# Patient Record
Sex: Male | Born: 1955 | Race: Black or African American | Hispanic: No | Marital: Single | Smoking: Current every day smoker
Health system: Southern US, Community
[De-identification: ages and names within clinical notes are randomized; demographics above are authoritative.]

## PROBLEM LIST (undated history)

## (undated) DIAGNOSIS — I10 Essential (primary) hypertension: Secondary | ICD-10-CM

## (undated) DIAGNOSIS — E7800 Pure hypercholesterolemia, unspecified: Secondary | ICD-10-CM

## (undated) HISTORY — PX: SHOULDER SURGERY: SHX246

## (undated) HISTORY — PX: BACK SURGERY: SHX140

---

## 1998-11-12 ENCOUNTER — Emergency Department (HOSPITAL_COMMUNITY): Admission: EM | Admit: 1998-11-12 | Discharge: 1998-11-13 | Payer: Self-pay | Admitting: *Deleted

## 1999-09-28 ENCOUNTER — Emergency Department (HOSPITAL_COMMUNITY): Admission: EM | Admit: 1999-09-28 | Discharge: 1999-09-28 | Payer: Self-pay | Admitting: Emergency Medicine

## 1999-09-28 ENCOUNTER — Encounter: Payer: Self-pay | Admitting: Emergency Medicine

## 2000-05-31 ENCOUNTER — Emergency Department (HOSPITAL_COMMUNITY): Admission: EM | Admit: 2000-05-31 | Discharge: 2000-05-31 | Payer: Self-pay | Admitting: Emergency Medicine

## 2000-06-22 ENCOUNTER — Emergency Department (HOSPITAL_COMMUNITY): Admission: EM | Admit: 2000-06-22 | Discharge: 2000-06-22 | Payer: Self-pay | Admitting: Emergency Medicine

## 2000-06-24 ENCOUNTER — Emergency Department (HOSPITAL_COMMUNITY): Admission: EM | Admit: 2000-06-24 | Discharge: 2000-06-24 | Payer: Self-pay | Admitting: Emergency Medicine

## 2000-06-25 ENCOUNTER — Inpatient Hospital Stay (HOSPITAL_COMMUNITY): Admission: EM | Admit: 2000-06-25 | Discharge: 2000-06-27 | Payer: Self-pay

## 2000-06-29 ENCOUNTER — Encounter: Admission: RE | Admit: 2000-06-29 | Discharge: 2000-06-29 | Payer: Self-pay | Admitting: Hematology and Oncology

## 2000-08-02 ENCOUNTER — Encounter: Admission: RE | Admit: 2000-08-02 | Discharge: 2000-08-02 | Payer: Self-pay | Admitting: Hematology and Oncology

## 2001-01-21 ENCOUNTER — Emergency Department (HOSPITAL_COMMUNITY): Admission: EM | Admit: 2001-01-21 | Discharge: 2001-01-21 | Payer: Self-pay | Admitting: Emergency Medicine

## 2001-01-21 ENCOUNTER — Encounter: Payer: Self-pay | Admitting: Emergency Medicine

## 2001-02-07 ENCOUNTER — Encounter: Admission: RE | Admit: 2001-02-07 | Discharge: 2001-02-07 | Payer: Self-pay | Admitting: Orthopaedic Surgery

## 2001-02-07 ENCOUNTER — Encounter: Payer: Self-pay | Admitting: Orthopaedic Surgery

## 2001-02-16 ENCOUNTER — Ambulatory Visit: Admission: RE | Admit: 2001-02-16 | Discharge: 2001-02-16 | Payer: Self-pay | Admitting: Orthopaedic Surgery

## 2002-10-25 ENCOUNTER — Emergency Department (HOSPITAL_COMMUNITY): Admission: EM | Admit: 2002-10-25 | Discharge: 2002-10-25 | Payer: Self-pay | Admitting: Emergency Medicine

## 2003-05-17 ENCOUNTER — Emergency Department (HOSPITAL_COMMUNITY): Admission: EM | Admit: 2003-05-17 | Discharge: 2003-05-17 | Payer: Self-pay | Admitting: Emergency Medicine

## 2003-05-19 ENCOUNTER — Emergency Department (HOSPITAL_COMMUNITY): Admission: EM | Admit: 2003-05-19 | Discharge: 2003-05-19 | Payer: Self-pay | Admitting: Emergency Medicine

## 2003-06-06 ENCOUNTER — Emergency Department (HOSPITAL_COMMUNITY): Admission: EM | Admit: 2003-06-06 | Discharge: 2003-06-06 | Payer: Self-pay | Admitting: Emergency Medicine

## 2003-06-27 ENCOUNTER — Emergency Department (HOSPITAL_COMMUNITY): Admission: EM | Admit: 2003-06-27 | Discharge: 2003-06-27 | Payer: Self-pay | Admitting: Emergency Medicine

## 2003-12-20 ENCOUNTER — Emergency Department (HOSPITAL_COMMUNITY): Admission: EM | Admit: 2003-12-20 | Discharge: 2003-12-20 | Payer: Self-pay | Admitting: Emergency Medicine

## 2004-01-16 ENCOUNTER — Emergency Department (HOSPITAL_COMMUNITY): Admission: EM | Admit: 2004-01-16 | Discharge: 2004-01-16 | Payer: Self-pay | Admitting: Emergency Medicine

## 2005-02-12 ENCOUNTER — Emergency Department (HOSPITAL_COMMUNITY): Admission: EM | Admit: 2005-02-12 | Discharge: 2005-02-12 | Payer: Self-pay | Admitting: Emergency Medicine

## 2006-08-18 IMAGING — CT CT HEAD W/O CM
1 of 2 series · 13 of 30 positions shown, 17 images · IV contrast (agent unspecified)
Comparison: 01/16/04.

CLINICAL DATA: Headache. Sinus pressure. 
 HEAD CT WITHOUT CONTRAST:
TECHNIQUE: Contiguous axial images were obtained from the base of the skull through the vertex according to standard protocol without contrast.

[Series 2: brain · axial · 0.47mm/px · z∈[+149,+264]mm · 13 of 36 slices shown, 17 images]
[im 3/36  brain]
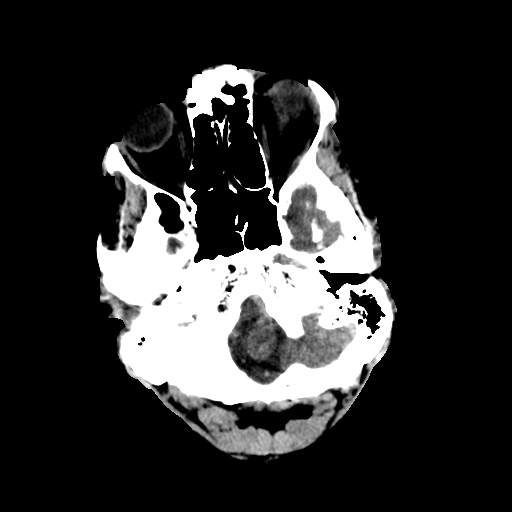
[im 3/36  bone]
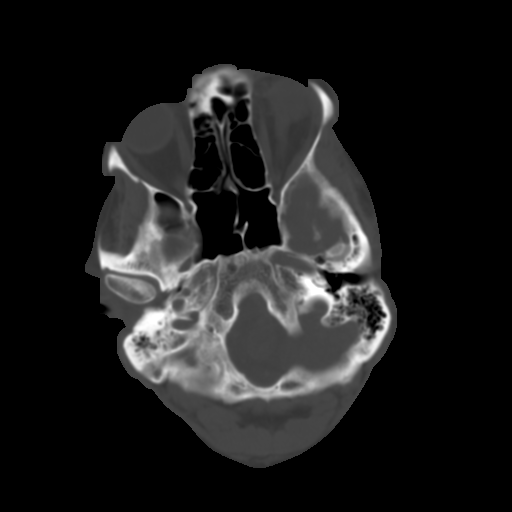
[im 6/36  brain]
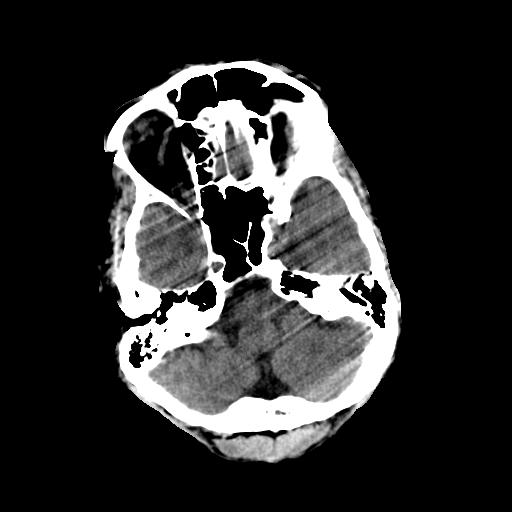
[im 8/36  brain]
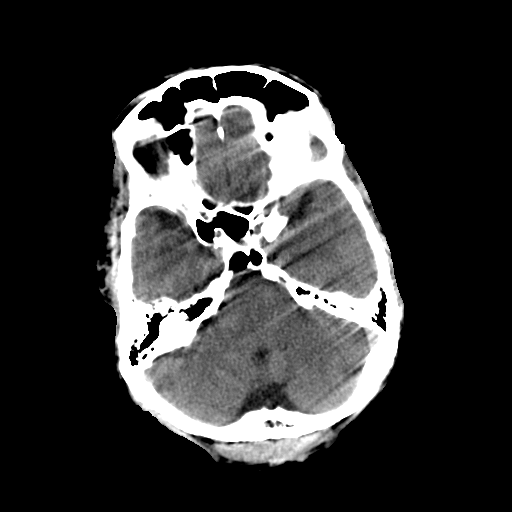
[im 11/36  brain]
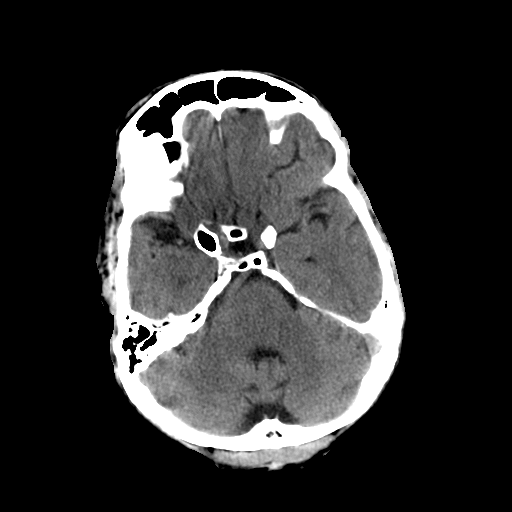
[im 13/36  brain]
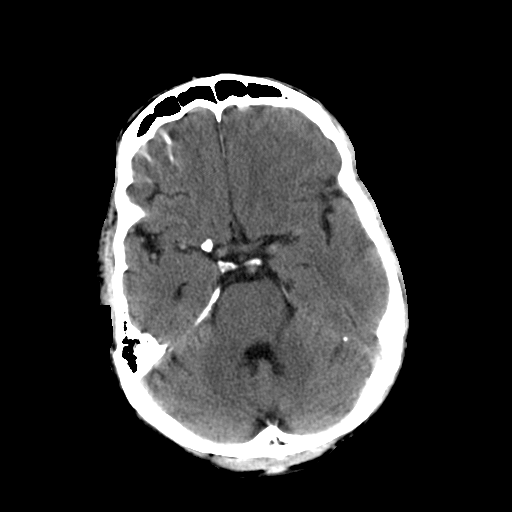
[im 13/36  bone]
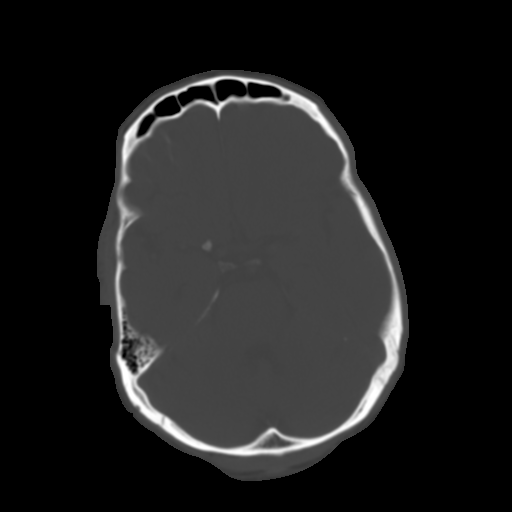
[im 16/36  brain]
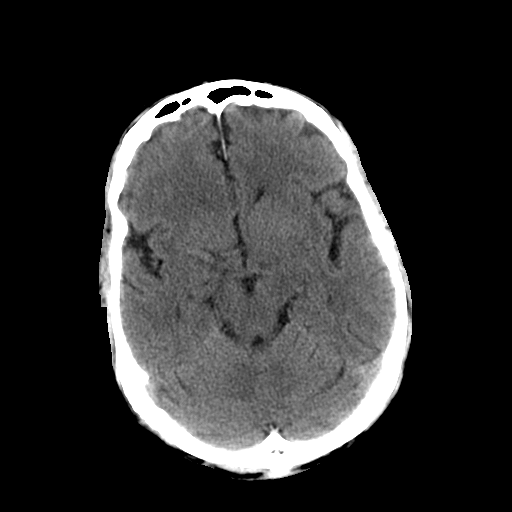
[im 18/36  brain]
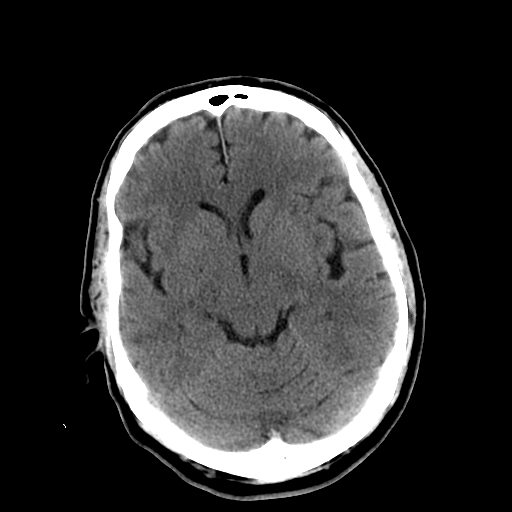
[im 21/36  brain]
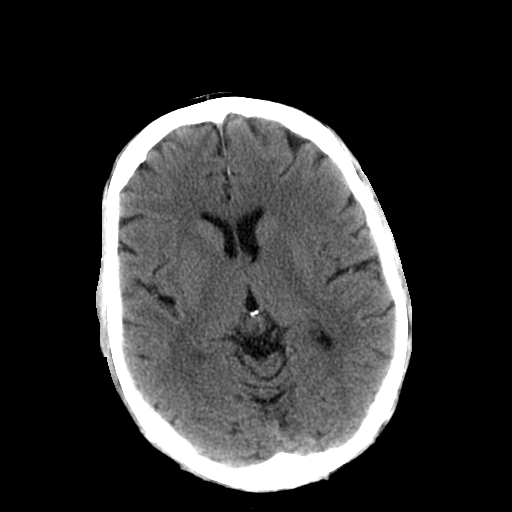
[im 23/36  brain]
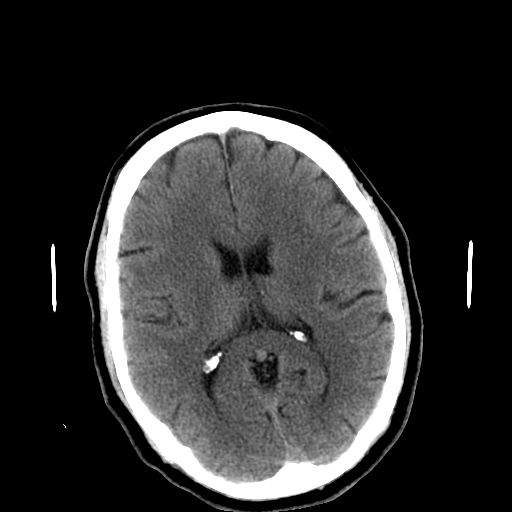
[im 23/36  bone]
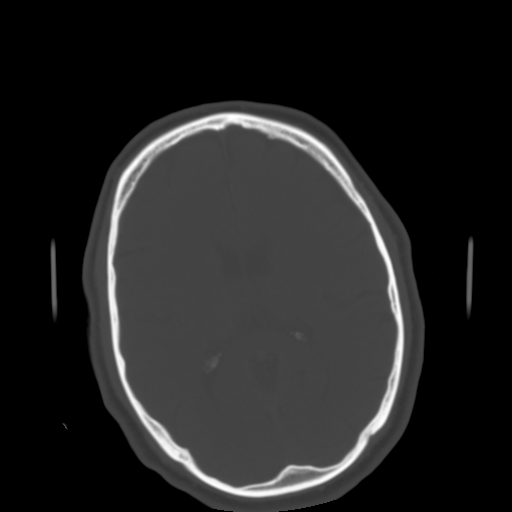
[im 26/36  brain]
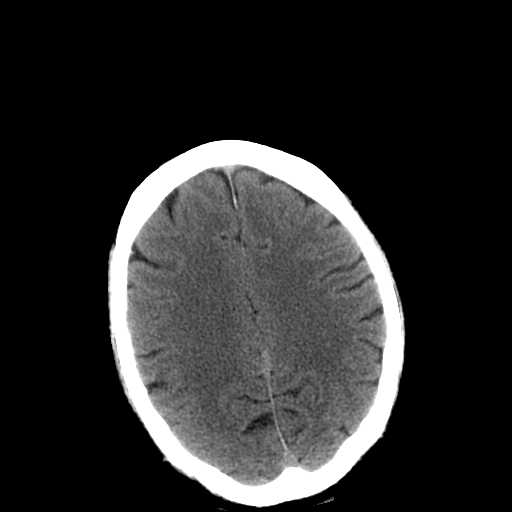
[im 28/36  brain]
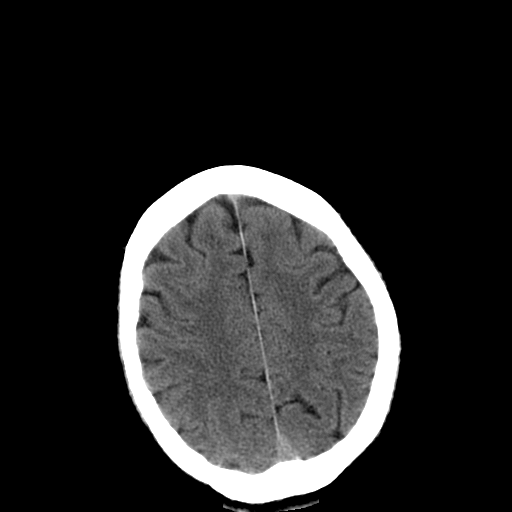
[im 31/36  brain]
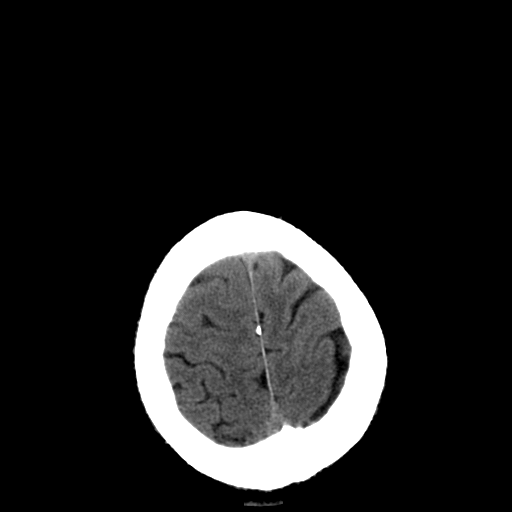
[im 33/36  brain]
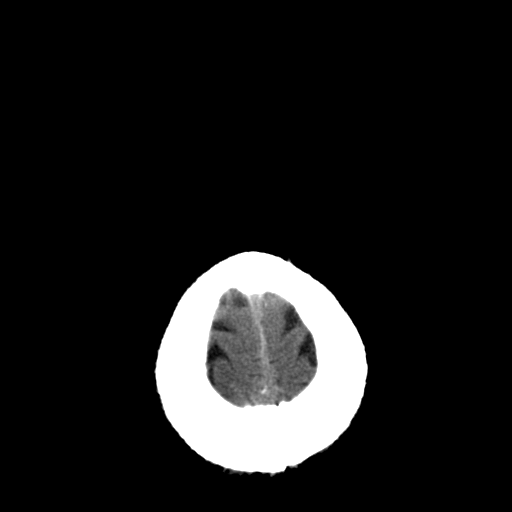
[im 33/36  bone]
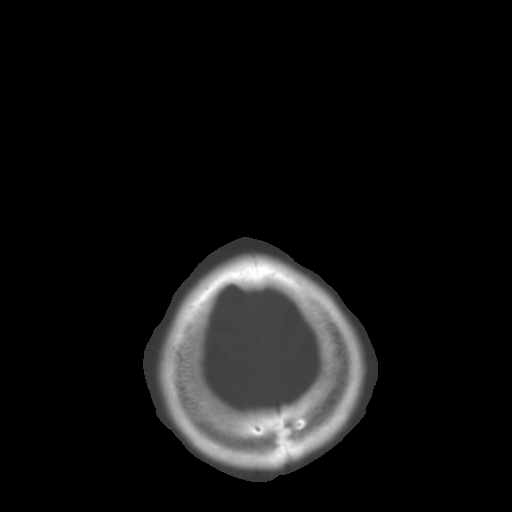

[13 of 30 positions shown; findings below may reference images not displayed]

FINDINGS: There is no evidence of acute intracranial abnormality, including hemorrhage, infarct, mass, mass effect, midline shift, or abnormal extraaxial fluid collection.  No hydrocephalus.  There is a pattern of mild atrophy that is unchanged.  Imaged paranasal sinuses and mastoid air cells are clear.
IMPRESSION: No acute intracranial abnormality.

## 2017-09-30 ENCOUNTER — Encounter (HOSPITAL_COMMUNITY): Payer: Self-pay | Admitting: Emergency Medicine

## 2017-09-30 ENCOUNTER — Emergency Department (HOSPITAL_COMMUNITY)
Admission: EM | Admit: 2017-09-30 | Discharge: 2017-09-30 | Disposition: A | Discharge status: Home / Self Care | Payer: Medicaid Other | Attending: Emergency Medicine | Admitting: Emergency Medicine

## 2017-09-30 ENCOUNTER — Other Ambulatory Visit: Payer: Self-pay

## 2017-09-30 ENCOUNTER — Emergency Department (HOSPITAL_COMMUNITY): Payer: Medicaid Other

## 2017-09-30 DIAGNOSIS — I10 Essential (primary) hypertension: Secondary | ICD-10-CM | POA: Insufficient documentation

## 2017-09-30 DIAGNOSIS — S46912A Strain of unspecified muscle, fascia and tendon at shoulder and upper arm level, left arm, initial encounter: Secondary | ICD-10-CM | POA: Diagnosis not present

## 2017-09-30 DIAGNOSIS — Y998 Other external cause status: Secondary | ICD-10-CM | POA: Insufficient documentation

## 2017-09-30 DIAGNOSIS — Y9301 Activity, walking, marching and hiking: Secondary | ICD-10-CM | POA: Insufficient documentation

## 2017-09-30 DIAGNOSIS — W010XXA Fall on same level from slipping, tripping and stumbling without subsequent striking against object, initial encounter: Secondary | ICD-10-CM | POA: Diagnosis not present

## 2017-09-30 DIAGNOSIS — F17200 Nicotine dependence, unspecified, uncomplicated: Secondary | ICD-10-CM | POA: Insufficient documentation

## 2017-09-30 DIAGNOSIS — Y929 Unspecified place or not applicable: Secondary | ICD-10-CM | POA: Diagnosis not present

## 2017-09-30 DIAGNOSIS — S4992XA Unspecified injury of left shoulder and upper arm, initial encounter: Secondary | ICD-10-CM | POA: Diagnosis present

## 2017-09-30 HISTORY — DX: Pure hypercholesterolemia, unspecified: E78.00

## 2017-09-30 HISTORY — DX: Essential (primary) hypertension: I10

## 2017-09-30 MED ORDER — HYDROCHLOROTHIAZIDE 12.5 MG PO CAPS
12.5000 mg | ORAL_CAPSULE | Freq: Every day | ORAL | Status: DC
  Administered 2017-09-30: 12.5 mg via ORAL
  Filled 2017-09-30: qty 1

## 2017-09-30 MED ORDER — TRAMADOL HCL 50 MG PO TABS: 50.0000 mg | ORAL_TABLET | Freq: Four times a day (QID) | ORAL | 0 refills | Status: AC | PRN

## 2017-09-30 MED ORDER — HYDROCODONE-ACETAMINOPHEN 5-325 MG PO TABS
1.0000 | ORAL_TABLET | Freq: Once | ORAL | Status: AC
  Administered 2017-09-30: 1 via ORAL
  Filled 2017-09-30: qty 1
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
## 2017-09-30 NOTE — ED Provider Notes (Signed)
[facility] Note   CSN: [id number] Arrival date & time: 09/30/17  1951     History   Chief Complaint Chief Complaint  Patient presents with  . Shoulder Pain    HPI Darryl Lang is a 62 y.o. male.  HPI   62 year old male presenting for evaluation of shoulder pain.  Patient reports 3 weeks ago he tripped and fell and landed on his left shoulder.  Since then he has had pain to his left shoulder radiates down his left arm.  Pain is persistent, worsening with movement.  He reports decreased range of motion secondary to pain.  Pain is rated at 10 out of 10 without any specific treatment tried.  He is right-hand dominant.  Pain does radiate towards his neck and his chest with movement.  At rest, pain improved.  Has history of high blood pressure and report not taking his blood pressure medication this AM.  He denies any exertional chest pain, shortness of breath, lightheadedness or dizziness or diaphoresis.  Denies numbness or weakness.    Past Medical History:  Diagnosis Date  . High cholesterol   . Hypertension     There are no active problems to display for this patient.   Past Surgical History:  Procedure Laterality Date  . BACK SURGERY    . SHOULDER SURGERY Right         Home Medications    Prior to Admission medications   Not on File    Family History No family history on file.  Social History Social History   Tobacco Use  . Smoking status: Current Every Day Smoker  . Smokeless tobacco: Never Used  Substance Use Topics  . Alcohol use: Yes  . Drug use: Not Currently     Allergies   Patient has no allergy information on record.   Review of Systems Review of Systems  All other systems reviewed and are negative.    Physical Exam Updated Vital Signs BP (!) 186/98 (BP Location: Right Arm)   Pulse (!) 102   Temp 97.6 F (36.4 C) (Oral)   Resp 18   Ht 5\' 8"  (1.727 m)   Wt 74.8 kg (165 lb)   SpO2  100%   BMI 25.09 kg/m   Physical Exam  Constitutional: He appears well-developed and well-nourished. No distress.  HENT:  Head: Atraumatic.  Eyes: Conjunctivae are normal.  Neck: Neck supple.  Cardiovascular: Normal rate and regular rhythm.  Pulmonary/Chest: Effort normal and breath sounds normal.  Abdominal: Soft. He exhibits no distension. There is no tenderness.  Musculoskeletal: He exhibits tenderness (Tenderness to left shoulder on palpation but diffusely.  Increased pain with range of motion but no limited range of motion.  No deformity.  Radial pulse 2+, arm compartment soft.).  Neurological: He is alert.  Skin: No rash noted.  Psychiatric: He has a normal mood and affect.  Nursing note and vitals reviewed.    ED Treatments / Results  Labs (all labs ordered are listed, but only abnormal results are displayed) Labs Reviewed - No data to display  EKG None  ED ECG REPORT   Date: 09/30/2017  Rate: 97  Rhythm: normal sinus rhythm  QRS Axis: normal  Intervals: QT prolonged  ST/T Wave abnormalities: nonspecific ST changes  Conduction Disutrbances:none  Narrative Interpretation:   Old EKG Reviewed: none available  I have personally reviewed the EKG tracing and agree with the computerized printout as noted.   Radiology Dg Shoulder  Left  Result Date: 09/30/2017 CLINICAL DATA:  Left shoulder pain after falling on the shoulder 3 weeks ago. EXAM: LEFT SHOULDER - 2+ VIEW COMPARISON:  None. FINDINGS: Moderate left inferior glenohumeral spur formation. No fracture or dislocation. IMPRESSION: No fracture or dislocation.  Moderate degenerative changes. Electronically Signed   By: Beckie Salts M.D.   On: 09/30/2017 21:06    Procedures Procedures (including critical care time)  Medications Ordered in ED Medications - No data to display   Initial Impression / Assessment and Plan / ED Course  I have reviewed the triage vital signs and the nursing notes.  Pertinent labs &  imaging results that were available during my care of the patient were reviewed by me and considered in my medical decision making (see chart for details).     BP (!) 188/102   Pulse 93   Temp 97.6 F (36.4 C) (Oral)   Resp 16   Ht 5\' 8"  (1.727 m)   Wt 74.8 kg (165 lb)   SpO2 100%   BMI 25.09 kg/m    Final Clinical Impressions(s) / ED Diagnoses   Final diagnoses:  Strain of left shoulder, initial encounter    ED Discharge Orders        Ordered    traMADol (ULTRAM) 50 MG tablet  Every 6 hours PRN     09/30/17 2207     9:29 PM Patient report he fell and landed directly on his left shoulder 3 weeks ago and has had persistent pain since.  X-ray of the left shoulder without any acute bony pathology.  He has pain with range of motion but no limited range of motion.  No deformity.  He is found to be hypertensive with blood pressure 192/117.  Did report pain radiates towards his chest and neck which I suspect is likely musculoskeletal however given his age and past medical history, will obtain EKG.  Blood pressure medication given.  Patient states he did not take his blood pressure medication this morning.  10:10 PM Blood pressure mildly improved with hydrochlorothiazide.  Encourage patient to have it rechecked and take his blood pressure as appropriate.  Return precautions discussed.   Fayrene Helper, PA-C 09/30/17 2210    Tegeler, Canary Brim, MD 10/01/17 [id number]

## 2017-09-30 NOTE — ED Notes (Signed)
Pt in xray

## 2017-09-30 NOTE — Discharge Instructions (Addendum)
Wear sling as needed for support.  Take pain medication as needed.  Follow up with your doctor for recheck of your blood pressure as it is elevated today.

## 2017-09-30 NOTE — ED Notes (Signed)
ED Provider at bedside. 

## 2019-04-05 IMAGING — CR DG SHOULDER 2+V*L*
3 series · 3 of 3 positions shown · non-contrast
Comparison: None.

CLINICAL DATA: Left shoulder pain after falling on the shoulder 3
weeks ago.

EXAM:
LEFT SHOULDER - 2+ VIEW

[shoulder grashey]
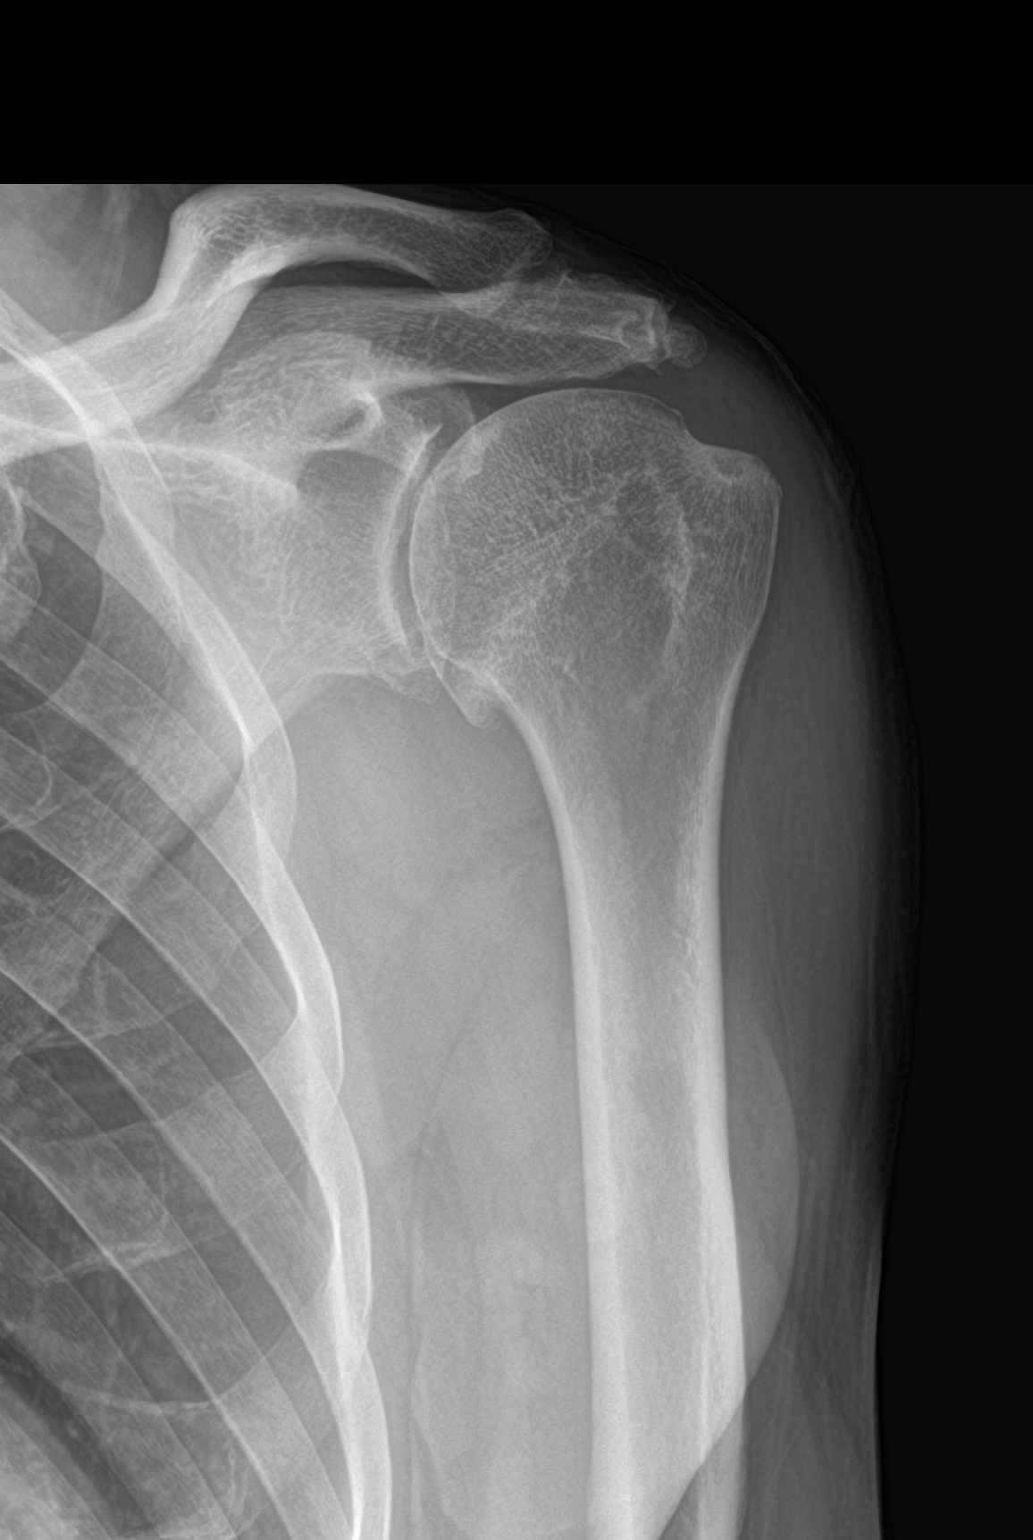

[shoulder y view]
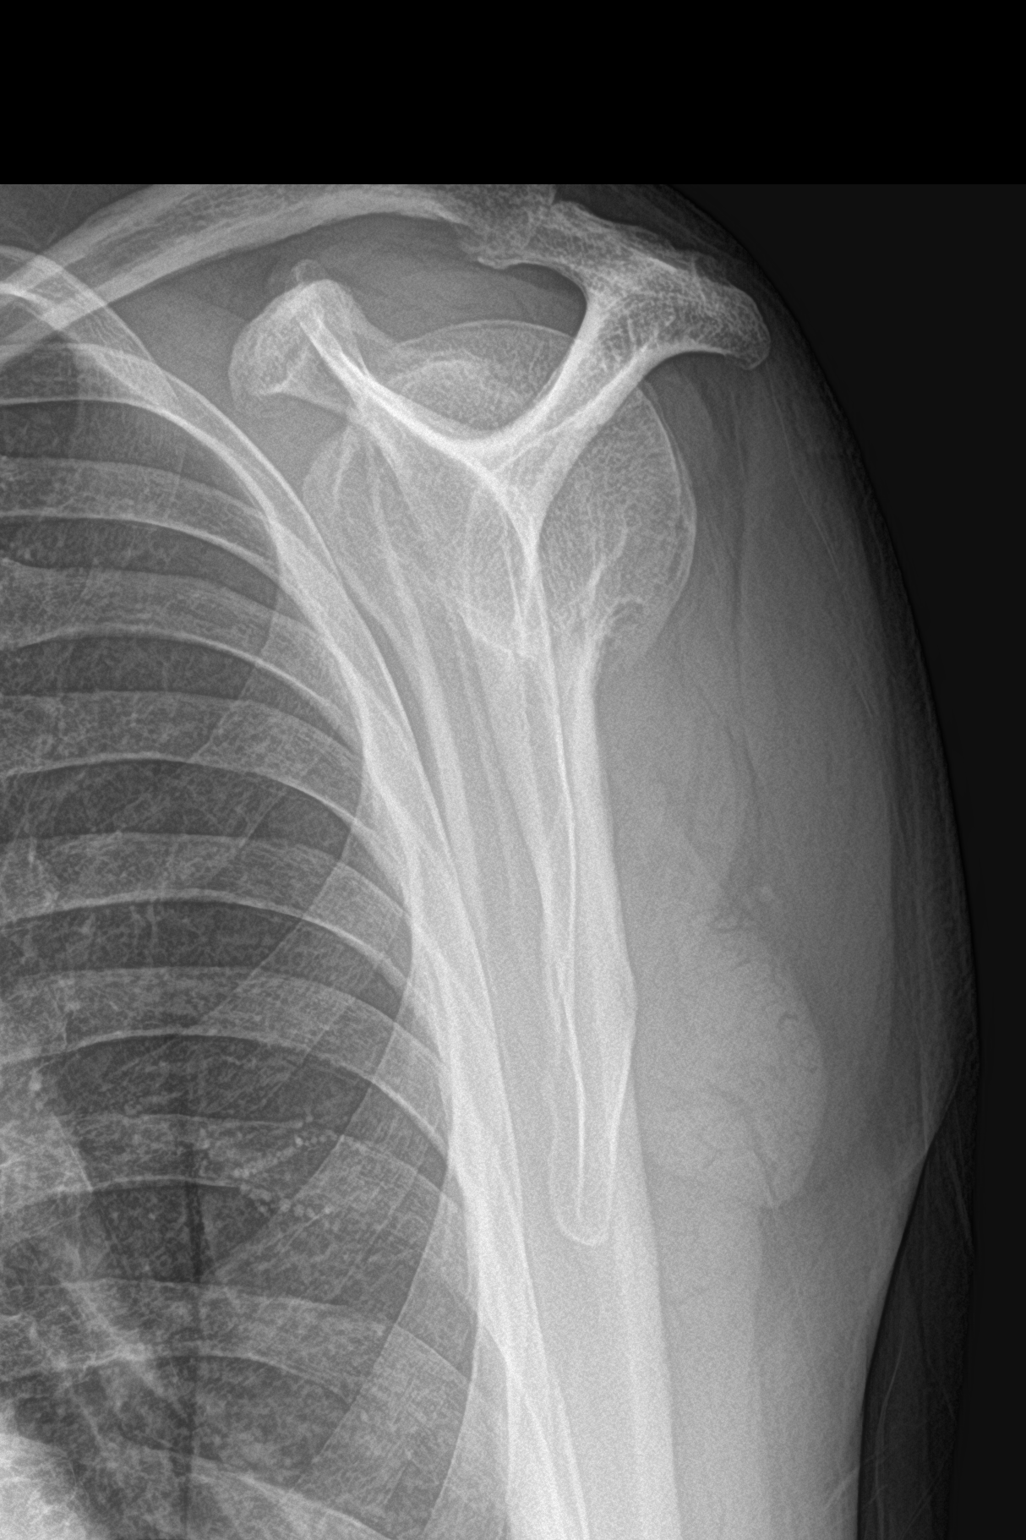

[shoulder axillary]
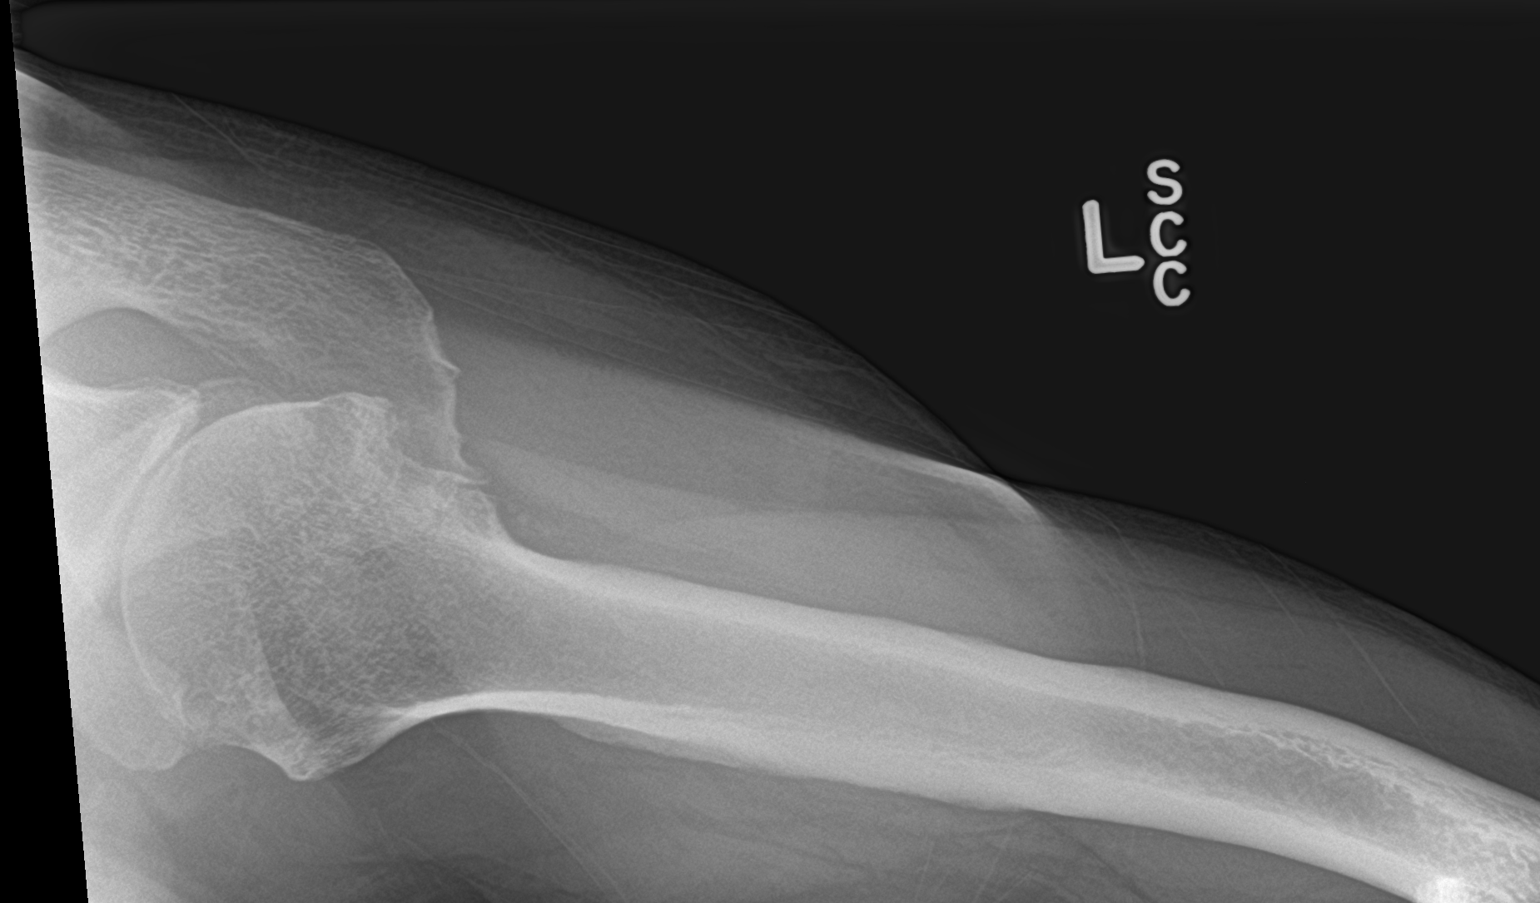

[3 of 3 positions shown; findings below may reference images not displayed]

FINDINGS: Moderate left inferior glenohumeral spur formation. No fracture or
dislocation.
IMPRESSION: No fracture or dislocation.  Moderate degenerative changes.
